# Patient Record
Sex: Female | Born: 1995 | Race: Black or African American | Hispanic: No | Marital: Single | Smoking: Current every day smoker
Health system: Southern US, Community
[De-identification: ages and names within clinical notes are randomized; demographics above are authoritative.]

## PROBLEM LIST (undated history)

## (undated) DIAGNOSIS — Q870 Congenital malformation syndromes predominantly affecting facial appearance: Secondary | ICD-10-CM

## (undated) DIAGNOSIS — J45909 Unspecified asthma, uncomplicated: Secondary | ICD-10-CM

## (undated) HISTORY — PX: GASTROSTOMY W/ FEEDING TUBE: SUR642

---
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
## 2018-07-31 ENCOUNTER — Other Ambulatory Visit: Payer: Self-pay

## 2018-07-31 ENCOUNTER — Emergency Department (HOSPITAL_BASED_OUTPATIENT_CLINIC_OR_DEPARTMENT_OTHER)
Admission: EM | Admit: 2018-07-31 | Discharge: 2018-07-31 | Disposition: A | Discharge status: Home / Self Care | Payer: Medicaid Other | Attending: Emergency Medicine | Admitting: Emergency Medicine

## 2018-07-31 ENCOUNTER — Encounter (HOSPITAL_BASED_OUTPATIENT_CLINIC_OR_DEPARTMENT_OTHER): Payer: Self-pay | Admitting: *Deleted

## 2018-07-31 DIAGNOSIS — J45909 Unspecified asthma, uncomplicated: Secondary | ICD-10-CM | POA: Diagnosis not present

## 2018-07-31 DIAGNOSIS — R5381 Other malaise: Secondary | ICD-10-CM | POA: Insufficient documentation

## 2018-07-31 DIAGNOSIS — R109 Unspecified abdominal pain: Secondary | ICD-10-CM | POA: Insufficient documentation

## 2018-07-31 DIAGNOSIS — F17210 Nicotine dependence, cigarettes, uncomplicated: Secondary | ICD-10-CM | POA: Insufficient documentation

## 2018-07-31 DIAGNOSIS — R42 Dizziness and giddiness: Secondary | ICD-10-CM | POA: Insufficient documentation

## 2018-07-31 LAB — URINALYSIS, ROUTINE W REFLEX MICROSCOPIC
Bilirubin Urine: NEGATIVE
Glucose, UA: NEGATIVE mg/dL
Hgb urine dipstick: NEGATIVE
Ketones, ur: NEGATIVE mg/dL
Leukocytes,Ua: NEGATIVE
Nitrite: NEGATIVE
Protein, ur: 30 mg/dL — AB
Specific Gravity, Urine: 1.015 (ref 1.005–1.030)
pH: 6 (ref 5.0–8.0)

## 2018-07-31 LAB — COMPREHENSIVE METABOLIC PANEL
ALT: 17 U/L (ref 0–44)
AST: 16 U/L (ref 15–41)
Albumin: 3.9 g/dL (ref 3.5–5.0)
Alkaline Phosphatase: 59 U/L (ref 38–126)
Anion gap: 11 (ref 5–15)
BUN: 16 mg/dL (ref 6–20)
CO2: 25 mmol/L (ref 22–32)
Calcium: 9.5 mg/dL (ref 8.9–10.3)
Chloride: 99 mmol/L (ref 98–111)
Creatinine, Ser: 0.8 mg/dL (ref 0.44–1.00)
GFR calc Af Amer: >60 mL/min (ref >60)
GFR calc non Af Amer: >60 mL/min (ref >60)
Glucose, Bld: 100 mg/dL — ABNORMAL HIGH (ref 70–99)
Potassium: 4.1 mmol/L (ref 3.5–5.1)
Sodium: 135 mmol/L (ref 135–145)
Total Bilirubin: 0.4 mg/dL (ref 0.3–1.2)
Total Protein: 8.7 g/dL — ABNORMAL HIGH (ref 6.5–8.1)

## 2018-07-31 LAB — CBC WITH DIFFERENTIAL/PLATELET
Abs Immature Granulocytes: 0.03 10*3/uL (ref 0.00–0.07)
Basophils Absolute: 0 10*3/uL (ref 0.0–0.1)
Basophils Relative: 0 %
Eosinophils Absolute: 0.1 10*3/uL (ref 0.0–0.5)
Eosinophils Relative: 1 %
HCT: 35.8 % — ABNORMAL LOW (ref 36.0–46.0)
Hemoglobin: 12.1 g/dL (ref 12.0–15.0)
Immature Granulocytes: 0 %
Lymphocytes Relative: 12 %
Lymphs Abs: 1.2 10*3/uL (ref 0.7–4.0)
MCH: 31.4 pg (ref 26.0–34.0)
MCHC: 33.8 g/dL (ref 30.0–36.0)
MCV: 93 fL (ref 80.0–100.0)
Monocytes Absolute: 1.1 10*3/uL — ABNORMAL HIGH (ref 0.1–1.0)
Monocytes Relative: 11 %
Neutro Abs: 7.7 10*3/uL (ref 1.7–7.7)
Neutrophils Relative %: 76 %
Platelets: 295 10*3/uL (ref 150–400)
RBC: 3.85 MIL/uL — ABNORMAL LOW (ref 3.87–5.11)
RDW: 12.1 % (ref 11.5–15.5)
WBC: 10.1 10*3/uL (ref 4.0–10.5)
nRBC: 0 % (ref 0.0–0.2)

## 2018-07-31 LAB — URINALYSIS, MICROSCOPIC (REFLEX)

## 2018-07-31 LAB — PREGNANCY, URINE: Preg Test, Ur: NEGATIVE

## 2018-07-31 LAB — LIPASE, BLOOD: Lipase: 24 U/L (ref 11–51)

## 2018-07-31 MED ORDER — DICYCLOMINE HCL 10 MG PO CAPS
10.0000 mg | ORAL_CAPSULE | Freq: Once | ORAL | Status: AC
  Administered 2018-07-31: 10 mg via ORAL
  Filled 2018-07-31: qty 1

## 2018-07-31 MED ORDER — IBUPROFEN 200 MG PO TABS
200.0000 mg | ORAL_TABLET | Freq: Once | ORAL | Status: AC
  Administered 2018-07-31: 200 mg via ORAL
  Filled 2018-07-31: qty 1

## 2018-07-31 NOTE — ED Triage Notes (Signed)
Abdominal pain x 2 weeks. Vomiting a week ago. She started feeling better. Yesterday she was dizzy relieved by drinking water.

## 2018-08-01 NOTE — ED Provider Notes (Signed)
[facility] EMERGENCY DEPARTMENT Provider Note   CSN: [id number] Arrival date & time: 07/31/18  1658     History   Chief Complaint Chief Complaint  Patient presents with  . Abdominal Pain    HPI Jordan Stuart is a 23 y.o. female.     HPI   23yF with abdominal pain. Ongoing for two weeks. Pain in upper abdomen. Comes and goes. Poor appetite and energy levels. Feeling dizzy at times but this improved when she hydrates. No urinary complaints. No diarrhea.   Past Medical History:  Diagnosis Date  . Asthma   . Premature baby     There are no active problems to display for this patient.   Past Surgical History:  Procedure Laterality Date  . GASTROSTOMY W/ FEEDING TUBE       OB History   No obstetric history on file.      Home Medications    Prior to Admission medications   Medication Sig Start Date End Date Taking? Authorizing Provider  albuterol (PROAIR HFA) 108 (90 Base) MCG/ACT inhaler INHALE 2 PUFFS BY MOUTH THREE TIMES DAILY AS NEEDED 12/01/16  Yes [provider]  Beclomethasone Dipropionate (QVAR IN) Inhale into the lungs.    [provider]  ibuprofen (ADVIL,MOTRIN) 800 MG tablet Take 1 tablet (800 mg total) by mouth 3 (three) times daily. 07/26/14   Arby BarrettePfeiffer, Marcy, MD  orphenadrine (NORFLEX) 100 MG tablet Take 1 tablet (100 mg total) by mouth 2 (two) times daily. 07/26/14   Arby BarrettePfeiffer, Marcy, MD    Family History No family history on file.  Social History Social History   Tobacco Use  . Smoking status: Current Every Day Smoker    Types: Cigarettes  . Smokeless tobacco: Never Used  Substance Use Topics  . Alcohol use: No  . Drug use: Never     Allergies   Patient has no known allergies.   Review of Systems Review of Systems  All systems reviewed and negative, other than as noted in HPI.  Physical Exam Updated Vital Signs BP (!) 97/58   Pulse 72   Temp 98.2 F (36.8 C) (Oral)   Resp 16   Ht 4\' 8"  (1.422  m)   Wt 30.2 kg   LMP 07/22/2018   SpO2 100%   BMI 14.91 kg/m   Physical Exam Vitals signs and nursing note reviewed.  Constitutional:      General: She is not in acute distress.    Appearance: She is well-developed.  HENT:     Head: Normocephalic and atraumatic.  Eyes:     General:        Right eye: No discharge.        Left eye: No discharge.     Conjunctiva/sclera: Conjunctivae normal.  Neck:     Musculoskeletal: Neck supple.  Cardiovascular:     Rate and Rhythm: Normal rate and regular rhythm.     Heart sounds: Normal heart sounds. No murmur. No friction rub. No gallop.   Pulmonary:     Effort: Pulmonary effort is normal. No respiratory distress.     Breath sounds: Normal breath sounds.  Abdominal:     General: There is no distension.     Palpations: Abdomen is soft.     Tenderness: There is no abdominal tenderness.  Musculoskeletal:        General: No tenderness.  Skin:    General: Skin is warm and dry.  Neurological:     Mental Status: She is  alert.  Psychiatric:        Behavior: Behavior normal.        Thought Content: Thought content normal.      ED Treatments / Results  Labs (all labs ordered are listed, but only abnormal results are displayed) Labs Reviewed  CBC WITH DIFFERENTIAL/PLATELET - Abnormal; Notable for the following components:      Result Value   RBC 3.85 (*)    HCT 35.8 (*)    Monocytes Absolute 1.1 (*)    All other components within normal limits  COMPREHENSIVE METABOLIC PANEL - Abnormal; Notable for the following components:   Glucose, Bld 100 (*)    Total Protein 8.7 (*)    All other components within normal limits  URINALYSIS, ROUTINE W REFLEX MICROSCOPIC - Abnormal; Notable for the following components:   Protein, ur 30 (*)    All other components within normal limits  URINALYSIS, MICROSCOPIC (REFLEX) - Abnormal; Notable for the following components:   Bacteria, UA FEW (*)    All other components within normal limits  LIPASE,  BLOOD  PREGNANCY, URINE    EKG    Radiology No results found.  Procedures Procedures (including critical care time)  Medications Ordered in ED Medications  ibuprofen (ADVIL) tablet 200 mg (200 mg Oral Given 07/31/18 1908)  dicyclomine (BENTYL) capsule 10 mg (10 mg Oral Given 07/31/18 1908)     Initial Impression / Assessment and Plan / ED Course  I have reviewed the triage vital signs and the nursing notes.  Pertinent labs & imaging results that were available during my care of the patient were reviewed by me and considered in my medical decision making (see chart for details).   23yF with abdominal pain and general malaise. Benign exam and work-up.   Final Clinical Impressions(s) / ED Diagnoses   Final diagnoses:  Abdominal pain, unspecified abdominal location    ED Discharge Orders    None       Virgel Manifold, MD 08/01/18 1048

## 2022-07-26 ENCOUNTER — Emergency Department (HOSPITAL_BASED_OUTPATIENT_CLINIC_OR_DEPARTMENT_OTHER)
Admission: EM | Admit: 2022-07-26 | Discharge: 2022-07-27 | Disposition: A | Discharge status: Home / Self Care | Payer: Medicaid Other | Attending: Emergency Medicine | Admitting: Emergency Medicine

## 2022-07-26 ENCOUNTER — Emergency Department (HOSPITAL_BASED_OUTPATIENT_CLINIC_OR_DEPARTMENT_OTHER): Payer: Medicaid Other

## 2022-07-26 ENCOUNTER — Other Ambulatory Visit: Payer: Self-pay

## 2022-07-26 ENCOUNTER — Encounter (HOSPITAL_BASED_OUTPATIENT_CLINIC_OR_DEPARTMENT_OTHER): Payer: Self-pay | Admitting: Urology

## 2022-07-26 DIAGNOSIS — F17210 Nicotine dependence, cigarettes, uncomplicated: Secondary | ICD-10-CM | POA: Diagnosis not present

## 2022-07-26 DIAGNOSIS — R0602 Shortness of breath: Secondary | ICD-10-CM | POA: Diagnosis present

## 2022-07-26 DIAGNOSIS — J189 Pneumonia, unspecified organism: Secondary | ICD-10-CM | POA: Insufficient documentation

## 2022-07-26 DIAGNOSIS — J45909 Unspecified asthma, uncomplicated: Secondary | ICD-10-CM | POA: Diagnosis not present

## 2022-07-26 DIAGNOSIS — J9801 Acute bronchospasm: Secondary | ICD-10-CM | POA: Insufficient documentation

## 2022-07-26 DIAGNOSIS — E876 Hypokalemia: Secondary | ICD-10-CM | POA: Diagnosis not present

## 2022-07-26 DIAGNOSIS — R252 Cramp and spasm: Secondary | ICD-10-CM | POA: Insufficient documentation

## 2022-07-26 HISTORY — DX: Congenital malformation syndromes predominantly affecting facial appearance: Q87.0

## 2022-07-26 MED ORDER — ALBUTEROL SULFATE HFA 108 (90 BASE) MCG/ACT IN AERS: 2.0000 | INHALATION_SPRAY | RESPIRATORY_TRACT | Status: DC | PRN

## 2022-07-26 MED ORDER — METOCLOPRAMIDE HCL 5 MG/ML IJ SOLN
10.0000 mg | Freq: Once | INTRAMUSCULAR | Status: AC
  Administered 2022-07-26: 10 mg via INTRAVENOUS
  Filled 2022-07-26: qty 2

## 2022-07-26 NOTE — ED Provider Notes (Signed)
[facility] Provider Note: [name], MD, FACEP  CSN: [id number] MRN: [id number] ARRIVAL: 07/26/22 at 2127 ROOM: [facility]   CHIEF COMPLAINT  Shortness of Breath   HISTORY OF PRESENT ILLNESS  07/26/22 11:27 PM Jordan Stuart is a 27 y.o. female with a history of asthma.  She is here with shortness of breath and left upper back spasms for the past week.  When she has a spasm she rates the pain as an 8 out of 10.  She has been using her albuterol with only partial relief.  Her last breathing treatment was 3:30 PM.  She was given an albuterol inhaler here on arrival.   Past Medical History:  Diagnosis Date   Asthma    Otilio Jefferson syndrome    Premature baby     Past Surgical History:  Procedure Laterality Date   GASTROSTOMY W/ FEEDING TUBE      History reviewed. No pertinent family history.  Social History   Tobacco Use   Smoking status: Every Day    Types: Cigarettes   Smokeless tobacco: Never  Substance Use Topics   Alcohol use: No   Drug use: Never    Prior to Admission medications   Medication Sig Start Date End Date Taking? Authorizing Provider  amoxicillin (AMOXIL) 500 MG capsule Take 2 capsules (1,000 mg total) by mouth 3 (three) times daily for 5 days. 07/27/22 08/01/22 Yes Yonna Alwin, MD  fluconazole (DIFLUCAN) 150 MG tablet Take 1 tablet as needed for vaginal yeast infection.  May repeat in 3 days if symptoms persist. 07/27/22  Yes Eleftheria Taborn, MD  albuterol (PROAIR HFA) 108 (90 Base) MCG/ACT inhaler INHALE 2 PUFFS BY MOUTH THREE TIMES DAILY AS NEEDED 12/01/16   [provider]  Beclomethasone Dipropionate (QVAR IN) Inhale into the lungs.    [provider]  ibuprofen (ADVIL,MOTRIN) 800 MG tablet Take 1 tablet (800 mg total) by mouth 3 (three) times daily. 07/26/14   Arby Barrette, MD  orphenadrine (NORFLEX) 100 MG tablet Take 1 tablet (100 mg total) by mouth 2 (two) times daily. 07/26/14   Arby Barrette, MD     Allergies Patient has no known allergies.   REVIEW OF SYSTEMS  Negative except as noted here or in the History of Present Illness.   PHYSICAL EXAMINATION  Initial Vital Signs Blood pressure 111/69, pulse (!) 108, temperature 97.7 F (36.5 C), temperature source Oral, resp. rate (!) 24, height 4\' 8"  (1.422 m), weight 36.3 kg, SpO2 99 %.  Examination General: Well-developed, well-nourished female in no acute distress; appearance consistent with age of record HENT: Underdeveloped mandible; atraumatic Eyes: Normal appearance Neck: supple; well-healed tracheostomy scar Heart: regular rate and rhythm; tachycardia Lungs: Faint wheezing in right base otherwise clear Abdomen: soft; nondistended; nontender; bowel sounds present Extremities: No deformity; full range of motion; pulses normal Neurologic: Awake, alert and oriented; motor function intact in all extremities and symmetric; no facial droop Skin: Warm and dry Psychiatric: Normal mood and affect   RESULTS  Summary of this visit's results, reviewed and interpreted by myself:   EKG Interpretation  Date/Time:  Tuesday July 26 2022 21:38:20 EDT Ventricular Rate:  117 PR Interval:  104 QRS Duration: 76 QT Interval:  313 QTC Calculation: 437 R Axis:   82 Text Interpretation: Sinus tachycardia with sinus arrhythmia Borderline repolarization abnormality No previous ECGs available Confirmed by Paula Libra ([id number]) on 07/26/2022 11:23:46 PM       Laboratory Studies: Results for orders placed or performed during  the hospital encounter of 07/26/22 (from the past 24 hour(s))  CBC with Differential     Status: Abnormal   Collection Time: 07/26/22 11:55 PM  Result Value Ref Range   WBC 11.9 (H) 4.0 - 10.5 K/uL   RBC 4.37 3.87 - 5.11 MIL/uL   Hemoglobin 13.7 12.0 - 15.0 g/dL   HCT 29.5 62.1 - 30.8 %   MCV 87.4 80.0 - 100.0 fL   MCH 31.4 26.0 - 34.0 pg   MCHC 35.9 30.0 - 36.0 g/dL   RDW 65.7 84.6 - 96.2 %   Platelets 255 150  - 400 K/uL   nRBC 0.0 0.0 - 0.2 %   Neutrophils Relative % 84 %   Neutro Abs 9.9 (H) 1.7 - 7.7 K/uL   Lymphocytes Relative 7 %   Lymphs Abs 0.8 0.7 - 4.0 K/uL   Monocytes Relative 8 %   Monocytes Absolute 0.9 0.1 - 1.0 K/uL   Eosinophils Relative 1 %   Eosinophils Absolute 0.1 0.0 - 0.5 K/uL   Basophils Relative 0 %   Basophils Absolute 0.1 0.0 - 0.1 K/uL   Immature Granulocytes 0 %   Abs Immature Granulocytes 0.04 0.00 - 0.07 K/uL  Basic metabolic panel     Status: Abnormal   Collection Time: 07/26/22 11:55 PM  Result Value Ref Range   Sodium 135 135 - 145 mmol/L   Potassium 3.2 (L) 3.5 - 5.1 mmol/L   Chloride 104 98 - 111 mmol/L   CO2 19 (L) 22 - 32 mmol/L   Glucose, Bld 106 (H) 70 - 99 mg/dL   BUN 7 6 - 20 mg/dL   Creatinine, Ser 9.52 0.44 - 1.00 mg/dL   Calcium 9.3 8.9 - 84.1 mg/dL   GFR, Estimated >32 >44 mL/min   Anion gap 12 5 - 15  Magnesium     Status: None   Collection Time: 07/26/22 11:55 PM  Result Value Ref Range   Magnesium 2.0 1.7 - 2.4 mg/dL  Troponin I (High Sensitivity)     Status: None   Collection Time: 07/26/22 11:55 PM  Result Value Ref Range   Troponin I (High Sensitivity) 3 <18 ng/L  D-dimer, quantitative     Status: None   Collection Time: 07/27/22 12:14 AM  Result Value Ref Range   D-Dimer, Quant 0.32 0.00 - 0.50 ug/mL-FEU   Imaging Studies: CT Angio Chest PE W and/or Wo Contrast  Result Date: 07/27/2022 CLINICAL DATA:  Right-sided back pain and shortness of breath x1 week. EXAM: CT ANGIOGRAPHY CHEST WITH CONTRAST TECHNIQUE: Multidetector CT imaging of the chest was performed using the standard protocol during bolus administration of intravenous contrast. Multiplanar CT image reconstructions and MIPs were obtained to evaluate the vascular anatomy. RADIATION DOSE REDUCTION: This exam was performed according to the departmental dose-optimization program which includes automated exposure control, adjustment of the mA and/or kV according to patient  size and/or use of iterative reconstruction technique. CONTRAST:  75mL OMNIPAQUE IOHEXOL 350 MG/ML SOLN COMPARISON:  None Available. FINDINGS: Cardiovascular: The thoracic aorta is normal in appearance. Satisfactory opacification of the pulmonary arteries to the segmental level. No evidence of pulmonary embolism. Normal heart size. No pericardial effusion. Mediastinum/Nodes: No enlarged mediastinal, hilar, or axillary lymph nodes. Thyroid gland, trachea, and esophagus demonstrate no significant findings. Lungs/Pleura: Very mild anterior right upper lobe atelectasis and/or early infiltrate is seen (axial CT image 12 through 23, CT series 6). No pleural effusion or pneumothorax is identified. Upper Abdomen: No acute abnormality. Musculoskeletal: No  chest wall abnormality. No acute or significant osseous findings. Review of the MIP images confirms the above findings. IMPRESSION: 1. No evidence of pulmonary embolism. 2. Very mild anterior right upper lobe atelectasis and/or early infiltrate. Electronically Signed   By: Aram Candela M.D.   On: 07/27/2022 01:15   DG Chest 2 View  Result Date: 07/26/2022 CLINICAL DATA:  shortness of breath, productive cough, posterior rib pain on right side. 27 y/o female with pain in right side of back and shortness of breath x 1 week . H/o asthma. EXAM: CHEST - 2 VIEW COMPARISON:  07/26/2014. FINDINGS: The heart size and mediastinal contours are within normal limits. Both lungs are clear. The visualized skeletal structures are unremarkable. IMPRESSION: No active cardiopulmonary disease. Electronically Signed   By: Burman Nieves M.D.   On: 07/26/2022 22:07    ED COURSE and MDM  Nursing notes, initial and subsequent vitals signs, including pulse oximetry, reviewed and interpreted by myself.  Vitals:   07/27/22 0000 07/27/22 0015 07/27/22 0030 07/27/22 0115  BP: (!) 139/95 110/77 105/72 102/71  Pulse: (!) 129 (!) 110 (!) 111 (!) 119  Resp: (!) 22 (!) 23 (!) 30 (!) 22   Temp:      TempSrc:      SpO2: 94% 97% 98% 95%  Weight:      Height:       Medications  albuterol (VENTOLIN HFA) 108 (90 Base) MCG/ACT inhaler 2 puff (has no administration in time range)  dexamethasone (DECADRON) injection 10 mg (has no administration in time range)  potassium chloride SA (KLOR-CON M) CR tablet 40 mEq (has no administration in time range)  amoxicillin (AMOXIL) capsule 1,000 mg (has no administration in time range)  metoCLOPramide (REGLAN) injection 10 mg (10 mg Intravenous Given 07/26/22 2355)  iohexol (OMNIPAQUE) 350 MG/ML injection 75 mL (75 mLs Intravenous Contrast Given 07/27/22 0045)  ipratropium-albuterol (DUONEB) 0.5-2.5 (3) MG/3ML nebulizer solution 3 mL (3 mLs Nebulization Given 07/27/22 0105)   The patient was observed having these "spasms" in her back.  No muscle spasms of the back itself were seen or palpated and these episodes, to this observer, appear to be consistent with hiccups.  1:30 AM Patient's lungs are clear and she feels better after a DuoNeb treatment.  We will give a dose of dexamethasone to help with her bronchospasm.  Her chest CT shows a possible early pneumonia and this could explain her elevated white count.  We will start her on an antibiotic.  The cause of her spasms/hiccups is unclear.  She did not get adequate relief with IV Reglan.  She does not have any gross electrolyte abnormalities except for a mildly low potassium and we will supplement this.    PROCEDURES  Procedures   ED DIAGNOSES     ICD-10-CM   1. Acute bronchospasm  J98.01     2. Community acquired pneumonia of right upper lobe of lung  J18.9     3. Hypokalemia  E87.6          Jazia Faraci, Jonny Ruiz, MD 07/27/22 [phone]

## 2022-07-26 NOTE — ED Triage Notes (Signed)
Pt states pain in right side of back and shortness of breath x 1 week   H/o asthma  Took breathing treatment at 1530

## 2022-07-27 ENCOUNTER — Other Ambulatory Visit: Payer: Self-pay

## 2022-07-27 ENCOUNTER — Emergency Department (HOSPITAL_BASED_OUTPATIENT_CLINIC_OR_DEPARTMENT_OTHER): Payer: Medicaid Other

## 2022-07-27 ENCOUNTER — Telehealth (HOSPITAL_BASED_OUTPATIENT_CLINIC_OR_DEPARTMENT_OTHER): Payer: Self-pay

## 2022-07-27 LAB — BASIC METABOLIC PANEL
Anion gap: 12 (ref 5–15)
BUN: 7 mg/dL (ref 6–20)
CO2: 19 mmol/L — ABNORMAL LOW (ref 22–32)
Calcium: 9.3 mg/dL (ref 8.9–10.3)
Chloride: 104 mmol/L (ref 98–111)
Creatinine, Ser: 0.74 mg/dL (ref 0.44–1.00)
GFR, Estimated: >60 mL/min (ref >60)
Glucose, Bld: 106 mg/dL — ABNORMAL HIGH (ref 70–99)
Potassium: 3.2 mmol/L — ABNORMAL LOW (ref 3.5–5.1)
Sodium: 135 mmol/L (ref 135–145)

## 2022-07-27 LAB — CBC WITH DIFFERENTIAL/PLATELET
Abs Immature Granulocytes: 0.04 10*3/uL (ref 0.00–0.07)
Basophils Absolute: 0.1 10*3/uL (ref 0.0–0.1)
Basophils Relative: 0 %
Eosinophils Absolute: 0.1 10*3/uL (ref 0.0–0.5)
Eosinophils Relative: 1 %
HCT: 38.2 % (ref 36.0–46.0)
Hemoglobin: 13.7 g/dL (ref 12.0–15.0)
Immature Granulocytes: 0 %
Lymphocytes Relative: 7 %
Lymphs Abs: 0.8 10*3/uL (ref 0.7–4.0)
MCH: 31.4 pg (ref 26.0–34.0)
MCHC: 35.9 g/dL (ref 30.0–36.0)
MCV: 87.4 fL (ref 80.0–100.0)
Monocytes Absolute: 0.9 10*3/uL (ref 0.1–1.0)
Monocytes Relative: 8 %
Neutro Abs: 9.9 10*3/uL — ABNORMAL HIGH (ref 1.7–7.7)
Neutrophils Relative %: 84 %
Platelets: 255 10*3/uL (ref 150–400)
RBC: 4.37 MIL/uL (ref 3.87–5.11)
RDW: 12.8 % (ref 11.5–15.5)
WBC: 11.9 10*3/uL — ABNORMAL HIGH (ref 4.0–10.5)
nRBC: 0 % (ref 0.0–0.2)

## 2022-07-27 LAB — D-DIMER, QUANTITATIVE: D-Dimer, Quant: 0.32 ug/mL-FEU (ref 0.00–0.50)

## 2022-07-27 LAB — TROPONIN I (HIGH SENSITIVITY): Troponin I (High Sensitivity): 3 ng/L (ref <18)

## 2022-07-27 LAB — MAGNESIUM: Magnesium: 2 mg/dL (ref 1.7–2.4)

## 2022-07-27 MED ORDER — AMOXICILLIN 500 MG PO CAPS: 1000.0000 mg | ORAL_CAPSULE | Freq: Three times a day (TID) | ORAL | 0 refills | Status: AC

## 2022-07-27 MED ORDER — IOHEXOL 350 MG/ML SOLN
75.0000 mL | Freq: Once | INTRAVENOUS | Status: AC | PRN
  Administered 2022-07-27: 75 mL via INTRAVENOUS

## 2022-07-27 MED ORDER — POTASSIUM CHLORIDE CRYS ER 20 MEQ PO TBCR
40.0000 meq | EXTENDED_RELEASE_TABLET | Freq: Once | ORAL | Status: AC
  Administered 2022-07-27: 40 meq via ORAL
  Filled 2022-07-27: qty 2

## 2022-07-27 MED ORDER — FLUCONAZOLE 150 MG PO TABS: ORAL_TABLET | ORAL | 0 refills | Status: AC

## 2022-07-27 MED ORDER — IPRATROPIUM-ALBUTEROL 0.5-2.5 (3) MG/3ML IN SOLN
RESPIRATORY_TRACT | Status: AC
  Filled 2022-07-27: qty 3

## 2022-07-27 MED ORDER — DEXAMETHASONE SODIUM PHOSPHATE 10 MG/ML IJ SOLN
10.0000 mg | Freq: Once | INTRAMUSCULAR | Status: AC
  Administered 2022-07-27: 10 mg via INTRAVENOUS
  Filled 2022-07-27: qty 1

## 2022-07-27 MED ORDER — AMOXICILLIN 500 MG PO CAPS
1000.0000 mg | ORAL_CAPSULE | Freq: Once | ORAL | Status: AC
  Administered 2022-07-27: 1000 mg via ORAL
  Filled 2022-07-27: qty 2

## 2022-07-27 MED ORDER — IPRATROPIUM-ALBUTEROL 0.5-2.5 (3) MG/3ML IN SOLN
3.0000 mL | Freq: Once | RESPIRATORY_TRACT | Status: AC
  Administered 2022-07-27: 3 mL via RESPIRATORY_TRACT

## 2022-07-27 NOTE — ED Notes (Signed)
Notified Dr. Read Drivers patients HR in the 130s-140s ST rhythm. Patient not appearing in distress however complains of difficulty breathing. Spo2 100% on RA. Dr. Read Drivers notified of same. No orders at this time.

## 2022-07-27 NOTE — Telephone Encounter (Signed)
Pt called requesting a doctor note. Note provided for pt pick up.

## 2022-07-27 NOTE — ED Notes (Signed)
Patient transported to CT 

## 2022-12-17 ENCOUNTER — Emergency Department (HOSPITAL_BASED_OUTPATIENT_CLINIC_OR_DEPARTMENT_OTHER)
Admission: EM | Admit: 2022-12-17 | Discharge: 2022-12-17 | Disposition: A | Discharge status: Home / Self Care | Payer: Medicaid Other | Attending: Emergency Medicine | Admitting: Emergency Medicine

## 2022-12-17 ENCOUNTER — Encounter (HOSPITAL_BASED_OUTPATIENT_CLINIC_OR_DEPARTMENT_OTHER): Payer: Self-pay | Admitting: Emergency Medicine

## 2022-12-17 DIAGNOSIS — R059 Cough, unspecified: Secondary | ICD-10-CM | POA: Diagnosis not present

## 2022-12-17 DIAGNOSIS — Z1152 Encounter for screening for COVID-19: Secondary | ICD-10-CM | POA: Insufficient documentation

## 2022-12-17 DIAGNOSIS — R112 Nausea with vomiting, unspecified: Secondary | ICD-10-CM | POA: Diagnosis present

## 2022-12-17 DIAGNOSIS — R058 Other specified cough: Secondary | ICD-10-CM

## 2022-12-17 LAB — CBC
HCT: 41.6 % (ref 36.0–46.0)
Hemoglobin: 14.6 g/dL (ref 12.0–15.0)
MCH: 30.8 pg (ref 26.0–34.0)
MCHC: 35.1 g/dL (ref 30.0–36.0)
MCV: 87.8 fL (ref 80.0–100.0)
Platelets: 318 10*3/uL (ref 150–400)
RBC: 4.74 MIL/uL (ref 3.87–5.11)
RDW: 12.2 % (ref 11.5–15.5)
WBC: 11.7 10*3/uL — ABNORMAL HIGH (ref 4.0–10.5)
nRBC: 0 % (ref 0.0–0.2)

## 2022-12-17 LAB — COMPREHENSIVE METABOLIC PANEL
ALT: 27 U/L (ref 0–44)
AST: 29 U/L (ref 15–41)
Albumin: 4.5 g/dL (ref 3.5–5.0)
Alkaline Phosphatase: 88 U/L (ref 38–126)
Anion gap: 15 (ref 5–15)
BUN: 16 mg/dL (ref 6–20)
CO2: 22 mmol/L (ref 22–32)
Calcium: 10.2 mg/dL (ref 8.9–10.3)
Chloride: 100 mmol/L (ref 98–111)
Creatinine, Ser: 0.93 mg/dL (ref 0.44–1.00)
GFR, Estimated: >60 mL/min (ref >60)
Glucose, Bld: 100 mg/dL — ABNORMAL HIGH (ref 70–99)
Potassium: 3.8 mmol/L (ref 3.5–5.1)
Sodium: 137 mmol/L (ref 135–145)
Total Bilirubin: 0.8 mg/dL (ref 0.3–1.2)
Total Protein: 9.2 g/dL — ABNORMAL HIGH (ref 6.5–8.1)

## 2022-12-17 LAB — SARS CORONAVIRUS 2 BY RT PCR: SARS Coronavirus 2 by RT PCR: NEGATIVE

## 2022-12-17 MED ORDER — ONDANSETRON HCL 4 MG/2ML IJ SOLN
4.0000 mg | Freq: Once | INTRAMUSCULAR | Status: AC
  Administered 2022-12-17: 4 mg via INTRAVENOUS
  Filled 2022-12-17: qty 2

## 2022-12-17 MED ORDER — ONDANSETRON 4 MG PO TBDP: 4.0000 mg | ORAL_TABLET | Freq: Three times a day (TID) | ORAL | 0 refills | Status: AC | PRN

## 2022-12-17 MED ORDER — LACTATED RINGERS IV BOLUS
1000.0000 mL | Freq: Once | INTRAVENOUS | Status: AC
  Administered 2022-12-17: 1000 mL via INTRAVENOUS

## 2022-12-17 NOTE — ED Provider Notes (Signed)
EMERGENCY DEPARTMENT AT [facility] Provider Note   CSN: [id number] Arrival date & time: 12/17/22  1035     History  Chief Complaint  Patient presents with   Emesis    Jordan Stuart is a 27 y.o. female.  Pt with c/o nausea and vomiting. Symptoms acute onset yesterday, several episodes, emesis is not bloody or bilious. No diarrhea, having normal bms. No abd pain or distension. No known ill contacts or bad food ingestion. No chest pain or sob. Occasional non prod cough. No sore throat or trouble swallowing. No fever or chills.   The history is provided by the patient and medical records.  Emesis Associated symptoms: cough   Associated symptoms: no abdominal pain, no chills, no diarrhea, no fever, no headaches and no sore throat        Home Medications Prior to Admission medications   Medication Sig Start Date End Date Taking? Authorizing Provider  albuterol (PROAIR HFA) 108 (90 Base) MCG/ACT inhaler INHALE 2 PUFFS BY MOUTH THREE TIMES DAILY AS NEEDED 12/01/16   [provider]  Beclomethasone Dipropionate (QVAR IN) Inhale into the lungs.    [provider]  fluconazole (DIFLUCAN) 150 MG tablet Take 1 tablet as needed for vaginal yeast infection.  May repeat in 3 days if symptoms persist. 07/27/22   Molpus, Jonny Ruiz, MD  ibuprofen (ADVIL,MOTRIN) 800 MG tablet Take 1 tablet (800 mg total) by mouth 3 (three) times daily. 07/26/14   Arby Barrette, MD  orphenadrine (NORFLEX) 100 MG tablet Take 1 tablet (100 mg total) by mouth 2 (two) times daily. 07/26/14   Arby Barrette, MD      Allergies    Patient has no known allergies.    Review of Systems   Review of Systems  Constitutional:  Negative for chills and fever.  HENT:  Negative for sore throat.   Eyes:  Negative for redness.  Respiratory:  Positive for cough. Negative for shortness of breath.   Cardiovascular:  Negative for chest pain.  Gastrointestinal:  Positive for nausea and vomiting.  Negative for abdominal pain, constipation and diarrhea.  Genitourinary:  Negative for dysuria, flank pain, vaginal bleeding and vaginal discharge.  Musculoskeletal:  Negative for back pain and neck pain.  Skin:  Negative for rash.  Neurological:  Negative for headaches.    Physical Exam Updated Vital Signs BP 114/75   Pulse 80   Temp 97.8 F (36.6 C)   Resp 18   SpO2 96%  Physical Exam Vitals and nursing note reviewed.  Constitutional:      Appearance: Normal appearance. She is well-developed.  HENT:     Head: Atraumatic.     Nose: Nose normal.     Mouth/Throat:     Mouth: Mucous membranes are moist.     Pharynx: Oropharynx is clear. No oropharyngeal exudate or posterior oropharyngeal erythema.  Eyes:     General: No scleral icterus.    Conjunctiva/sclera: Conjunctivae normal.     Pupils: Pupils are equal, round, and reactive to light.  Neck:     Trachea: No tracheal deviation.  Cardiovascular:     Rate and Rhythm: Normal rate and regular rhythm.     Pulses: Normal pulses.     Heart sounds: Normal heart sounds. No murmur heard.    No friction rub. No gallop.  Pulmonary:     Effort: Pulmonary effort is normal. No respiratory distress.     Breath sounds: Normal breath sounds.  Abdominal:  General: Bowel sounds are normal. There is no distension.     Palpations: Abdomen is soft. There is no mass.     Tenderness: There is no abdominal tenderness. There is no guarding.  Genitourinary:    Comments: No cva tenderness.  Musculoskeletal:        General: No swelling.     Cervical back: Normal range of motion and neck supple. No rigidity. No muscular tenderness.  Skin:    General: Skin is warm and dry.     Findings: No rash.  Neurological:     Mental Status: She is alert.     Comments: Alert, speech normal. Steady gait.   Psychiatric:        Mood and Affect: Mood normal.     ED Results / Procedures / Treatments   Labs (all labs ordered are listed, but only  abnormal results are displayed) Results for orders placed or performed during the hospital encounter of 12/17/22  SARS Coronavirus 2 by RT PCR (hospital order, performed in [facility] hospital lab) *cepheid single result test* Anterior Nasal Swab   Specimen: Anterior Nasal Swab  Result Value Ref Range   SARS Coronavirus 2 by RT PCR NEGATIVE NEGATIVE  CBC  Result Value Ref Range   WBC 11.7 (H) 4.0 - 10.5 K/uL   RBC 4.74 3.87 - 5.11 MIL/uL   Hemoglobin 14.6 12.0 - 15.0 g/dL   HCT 44.0 10.2 - 72.5 %   MCV 87.8 80.0 - 100.0 fL   MCH 30.8 26.0 - 34.0 pg   MCHC 35.1 30.0 - 36.0 g/dL   RDW 36.6 44.0 - 34.7 %   Platelets 318 150 - 400 K/uL   nRBC 0.0 0.0 - 0.2 %  Comprehensive metabolic panel  Result Value Ref Range   Sodium 137 135 - 145 mmol/L   Potassium 3.8 3.5 - 5.1 mmol/L   Chloride 100 98 - 111 mmol/L   CO2 22 22 - 32 mmol/L   Glucose, Bld 100 (H) 70 - 99 mg/dL   BUN 16 6 - 20 mg/dL   Creatinine, Ser 4.25 0.44 - 1.00 mg/dL   Calcium 95.6 8.9 - 38.7 mg/dL   Total Protein 9.2 (H) 6.5 - 8.1 g/dL   Albumin 4.5 3.5 - 5.0 g/dL   AST 29 15 - 41 U/L   ALT 27 0 - 44 U/L   Alkaline Phosphatase 88 38 - 126 U/L   Total Bilirubin 0.8 0.3 - 1.2 mg/dL   GFR, Estimated >56 >43 mL/min   Anion gap 15 5 - 15      EKG None  Radiology No results found.  Procedures Procedures    Medications Ordered in ED Medications  lactated ringers bolus 1,000 mL (0 mLs Intravenous Stopped 12/17/22 1336)  ondansetron (ZOFRAN) injection 4 mg (4 mg Intravenous Given 12/17/22 1229)    ED Course/ Medical Decision Making/ A&P                                 Medical Decision Making Problems Addressed: Nausea and vomiting in adult: acute illness or injury with systemic symptoms that poses a threat to life or bodily functions Non-productive cough: acute illness or injury  Amount and/or Complexity of Data Reviewed External Data Reviewed: notes. Labs: ordered. Decision-making details documented in  ED Course.  Risk Prescription drug management. Decision regarding hospitalization.   Iv ns. Continuous pulse ox and cardiac monitoring. Labs ordered/sent.  Differential diagnosis includes dehydration, aki, enteritis, food poisoning, etc. Dispo decision including potential need for admission considered - will get labs and reassess.   Reviewed nursing notes and prior charts for additional history. External reports reviewed.   LR bolus. Zofran iv.   Cardiac monitor: sinus rhythm, rate 90.  Labs reviewed/interpreted by me - covid neg. Hgb normal. Chem unremarkable. Pt declines upreg, indicating not sexually active, no gu c/o.   Abd soft nt. No recurrent emesis. No abd pain. Vitals normal.  Pt currently appears stable for d/c.   Rec pcp f/u.  Return precautions provided.            Final Clinical Impression(s) / ED Diagnoses Final diagnoses:  None    Rx / DC Orders ED Discharge Orders     None         Cathren Laine, MD 12/17/22 1435

## 2022-12-17 NOTE — ED Triage Notes (Signed)
Pt c/o emesis and weakness x 2 days, cough x 2 weeks, and back pain last night

## 2022-12-17 NOTE — Discharge Instructions (Signed)
It was our pleasure to provide your ER care today - we hope that you feel better.  Drink plenty of fluids/stay well hydrated. You may take zofran as need for nausea.   Follow up closely with primary care doctor Monday if symptoms fail to improve/resolve.  Return to ER if worse, new symptoms, fevers, persistent vomiting, new or severe abdominal pain, weak/fainting, trouble breathing, or other concern.

## 2022-12-17 NOTE — ED Notes (Signed)

## 2022-12-17 NOTE — ED Notes (Signed)
PT REFUSED PREGNANCY TEST, STATES SHE HAS NOT BEEN SEXUALLY ACTIVE IN OVER A YEAR.
